# Patient Record
Sex: Female | Born: 1950 | Race: White | Hispanic: No | Marital: Married | Smoking: Never smoker
Health system: Southern US, Community
[De-identification: ages and names within clinical notes are randomized; demographics above are authoritative.]

## PROBLEM LIST (undated history)

## (undated) DIAGNOSIS — I2510 Atherosclerotic heart disease of native coronary artery without angina pectoris: Secondary | ICD-10-CM

## (undated) DIAGNOSIS — I10 Essential (primary) hypertension: Secondary | ICD-10-CM

## (undated) HISTORY — PX: CORONARY STENT PLACEMENT: SHX1402

---

## 2014-06-04 ENCOUNTER — Emergency Department (HOSPITAL_COMMUNITY): Payer: BC Managed Care – PPO

## 2014-06-04 ENCOUNTER — Emergency Department (HOSPITAL_COMMUNITY)
Admission: EM | Admit: 2014-06-04 | Discharge: 2014-06-05 | Disposition: A | Discharge status: Home / Self Care | Payer: BC Managed Care – PPO | Attending: Emergency Medicine | Admitting: Emergency Medicine

## 2014-06-04 ENCOUNTER — Encounter (HOSPITAL_COMMUNITY): Payer: Self-pay | Admitting: Emergency Medicine

## 2014-06-04 DIAGNOSIS — Z9861 Coronary angioplasty status: Secondary | ICD-10-CM | POA: Insufficient documentation

## 2014-06-04 DIAGNOSIS — S4992XA Unspecified injury of left shoulder and upper arm, initial encounter: Secondary | ICD-10-CM | POA: Diagnosis present

## 2014-06-04 DIAGNOSIS — Y92410 Unspecified street and highway as the place of occurrence of the external cause: Secondary | ICD-10-CM | POA: Insufficient documentation

## 2014-06-04 DIAGNOSIS — S52332A Displaced oblique fracture of shaft of left radius, initial encounter for closed fracture: Secondary | ICD-10-CM | POA: Insufficient documentation

## 2014-06-04 DIAGNOSIS — Y998 Other external cause status: Secondary | ICD-10-CM | POA: Diagnosis not present

## 2014-06-04 DIAGNOSIS — Z79899 Other long term (current) drug therapy: Secondary | ICD-10-CM | POA: Insufficient documentation

## 2014-06-04 DIAGNOSIS — S52232A Displaced oblique fracture of shaft of left ulna, initial encounter for closed fracture: Secondary | ICD-10-CM | POA: Insufficient documentation

## 2014-06-04 DIAGNOSIS — G8911 Acute pain due to trauma: Secondary | ICD-10-CM

## 2014-06-04 DIAGNOSIS — I10 Essential (primary) hypertension: Secondary | ICD-10-CM | POA: Insufficient documentation

## 2014-06-04 DIAGNOSIS — I2510 Atherosclerotic heart disease of native coronary artery without angina pectoris: Secondary | ICD-10-CM | POA: Insufficient documentation

## 2014-06-04 DIAGNOSIS — Z7902 Long term (current) use of antithrombotics/antiplatelets: Secondary | ICD-10-CM | POA: Insufficient documentation

## 2014-06-04 DIAGNOSIS — Y9389 Activity, other specified: Secondary | ICD-10-CM | POA: Diagnosis not present

## 2014-06-04 DIAGNOSIS — S52202A Unspecified fracture of shaft of left ulna, initial encounter for closed fracture: Secondary | ICD-10-CM

## 2014-06-04 DIAGNOSIS — S5292XA Unspecified fracture of left forearm, initial encounter for closed fracture: Secondary | ICD-10-CM

## 2014-06-04 DIAGNOSIS — S50812A Abrasion of left forearm, initial encounter: Secondary | ICD-10-CM | POA: Insufficient documentation

## 2014-06-04 HISTORY — DX: Essential (primary) hypertension: I10

## 2014-06-04 HISTORY — DX: Atherosclerotic heart disease of native coronary artery without angina pectoris: I25.10

## 2014-06-04 MED ORDER — MORPHINE SULFATE 4 MG/ML IJ SOLN
4.0000 mg | Freq: Once | INTRAMUSCULAR | Status: AC
  Administered 2014-06-04: 4 mg via INTRAVENOUS
  Filled 2014-06-04: qty 1

## 2014-06-04 MED ORDER — OXYCODONE-ACETAMINOPHEN 5-325 MG PO TABS: 1.0000 | ORAL_TABLET | ORAL | Status: AC | PRN
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
## 2014-06-04 NOTE — ED Notes (Signed)
Pt sleeping. 
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
## 2014-06-04 NOTE — Progress Notes (Signed)
Orthopedic Tech Progress Note Patient Details:  Melinda KindredDeborah Harris 1951/01/21 [id number]  Ortho Devices Type of Ortho Device: Ace wrap, Arm sling, Sugartong splint Ortho Device/Splint Location: LUE Ortho Device/Splint Interventions: Ordered, Application   Jennye MoccasinHughes, Aerik Polan Craig 06/04/2014, 8:30 PM

## 2014-06-04 NOTE — ED Notes (Signed)
Pt sleeping appears comfortable splint intact to left forearm distal cms wnl

## 2014-06-04 NOTE — ED Notes (Signed)
Distal cms to left arm/5 fingers  Intact Lajoyce Corners/wnl

## 2014-06-04 NOTE — ED Notes (Signed)
Pt last ate/ drank at 1200 today instructed to be npo

## 2014-06-04 NOTE — Discharge Instructions (Signed)
Take the prescribed medication as directed.  Leave splint in place, wear sling to help support arm. Follow-up with your orthopedist once returning home. Return to the ED for new or worsening symptoms.

## 2014-06-04 NOTE — ED Notes (Signed)
ORTHO TECH CALLED 
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
## 2014-06-04 NOTE — ED Notes (Signed)
RETURNED FROM X-RAY

## 2014-06-05 MED ORDER — MORPHINE SULFATE 4 MG/ML IJ SOLN
4.0000 mg | Freq: Once | INTRAMUSCULAR | Status: AC
  Administered 2014-06-05: 4 mg via INTRAVENOUS
  Filled 2014-06-05: qty 1
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
